# Patient Record
Sex: Male | Born: 1995 | Race: Black or African American | Hispanic: No | Marital: Single | Smoking: Never smoker
Health system: Southern US, Community
[De-identification: ages and names within clinical notes are randomized; demographics above are authoritative.]

---
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
## 2019-07-12 ENCOUNTER — Other Ambulatory Visit: Payer: Self-pay

## 2019-07-12 ENCOUNTER — Emergency Department (HOSPITAL_COMMUNITY)
Admission: EM | Admit: 2019-07-12 | Discharge: 2019-07-12 | Disposition: A | Discharge status: Home / Self Care | Payer: Self-pay | Attending: Emergency Medicine | Admitting: Emergency Medicine

## 2019-07-12 DIAGNOSIS — R0789 Other chest pain: Secondary | ICD-10-CM | POA: Insufficient documentation

## 2019-07-12 DIAGNOSIS — Z5321 Procedure and treatment not carried out due to patient leaving prior to being seen by health care provider: Secondary | ICD-10-CM | POA: Insufficient documentation

## 2019-07-12 NOTE — ED Notes (Signed)
Pt called 3x for triage. No answer. Pt not seen in waiting room, waiting room bathroom, or outside lobby doors.

## 2019-07-21 ENCOUNTER — Ambulatory Visit (INDEPENDENT_AMBULATORY_CARE_PROVIDER_SITE_OTHER): Payer: Self-pay

## 2019-07-21 ENCOUNTER — Ambulatory Visit (HOSPITAL_COMMUNITY)
Admission: EM | Admit: 2019-07-21 | Discharge: 2019-07-21 | Disposition: A | Discharge status: Home / Self Care | Payer: Self-pay | Attending: Emergency Medicine | Admitting: Emergency Medicine

## 2019-07-21 ENCOUNTER — Encounter (HOSPITAL_COMMUNITY): Payer: Self-pay

## 2019-07-21 ENCOUNTER — Other Ambulatory Visit: Payer: Self-pay

## 2019-07-21 DIAGNOSIS — S93402A Sprain of unspecified ligament of left ankle, initial encounter: Secondary | ICD-10-CM

## 2019-07-21 DIAGNOSIS — M25572 Pain in left ankle and joints of left foot: Secondary | ICD-10-CM

## 2019-07-21 MED ORDER — IBUPROFEN 800 MG PO TABS: 800.0000 mg | ORAL_TABLET | Freq: Three times a day (TID) | ORAL | 0 refills | Status: AC

## 2019-07-21 NOTE — ED Triage Notes (Signed)
Pt is here with left ankle pain after spraining left ankle on Monday after playing basketball, pt has taken Tylenol to relieve discomfort.

## 2019-07-21 NOTE — Discharge Instructions (Signed)
No fracture on xray  Use anti-inflammatories for pain/swelling. You may take up to 800 mg Ibuprofen every 8 hours with food. You may supplement Ibuprofen with Tylenol [phone] mg every 8 hours.  Ice and elevate Boot for comfort Follow up if not improving over the next 1-2 weeks

## 2019-07-21 NOTE — ED Provider Notes (Signed)
[facility]    CSN: [id number] Arrival date & time: 07/21/19  1144      History   Chief Complaint Chief Complaint  Patient presents with  . Ankle Injury    HPI Rodney Frank is a 24 y.o. male presenting today for evaluation of left ankle injury.  Patient reports approximately 1 week ago he was playing basketball and rolled his left ankle and has since had pain and swelling.  He has been using the boot from prior ankle sprains.  Does report prior fracture in this ankle.  Has used ibuprofen 400 mg occasionally.  HPI  History reviewed. No pertinent past medical history.  There are no problems to display for this patient.   History reviewed. No pertinent surgical history.     Home Medications    Prior to Admission medications   Medication Sig Start Date End Date Taking? Authorizing Provider  ibuprofen (ADVIL) 800 MG tablet Take 1 tablet (800 mg total) by mouth 3 (three) times daily. 07/21/19   Decari Duggar, Junius Creamer, PA-C    Family History Family History  Problem Relation Age of Onset  . Healthy Mother   . Healthy Father     Social History Social History   Tobacco Use  . Smoking status: Never Smoker  . Smokeless tobacco: Never Used  Substance Use Topics  . Alcohol use: No  . Drug use: Yes    Types: Marijuana     Allergies   Patient has no known allergies.   Review of Systems Review of Systems  Constitutional: Negative for fatigue and fever.  Eyes: Negative for redness, itching and visual disturbance.  Respiratory: Negative for shortness of breath.   Cardiovascular: Negative for chest pain and leg swelling.  Gastrointestinal: Negative for nausea and vomiting.  Musculoskeletal: Positive for arthralgias, gait problem and joint swelling. Negative for myalgias.  Skin: Negative for color change, rash and wound.  Neurological: Negative for dizziness, syncope, weakness, light-headedness and headaches.     Physical Exam Triage Vital Signs ED  Triage Vitals  Enc Vitals Group     BP 07/21/19 1206 116/71     Pulse Rate 07/21/19 1206 (!) 58     Resp 07/21/19 1206 18     Temp 07/21/19 1206 98.3 F (36.8 C)     Temp Source 07/21/19 1206 Oral     SpO2 07/21/19 1206 98 %     Weight --      Height --      Head Circumference --      Peak Flow --      Pain Score 07/21/19 1203 6     Pain Loc --      Pain Edu? --      Excl. in GC? --    No data found.  Updated Vital Signs BP 116/71 (BP Location: Right Arm)   Pulse (!) 58   Temp 98.3 F (36.8 C) (Oral)   Resp 18   SpO2 98%   Visual Acuity Right Eye Distance:   Left Eye Distance:   Bilateral Distance:    Right Eye Near:   Left Eye Near:    Bilateral Near:     Physical Exam Vitals and nursing note reviewed.  Constitutional:      Appearance: He is well-developed.     Comments: No acute distress  HENT:     Head: Normocephalic and atraumatic.     Nose: Nose normal.  Eyes:     Conjunctiva/sclera: Conjunctivae normal.  Cardiovascular:  Rate and Rhythm: Normal rate.  Pulmonary:     Effort: Pulmonary effort is normal. No respiratory distress.  Abdominal:     General: There is no distension.  Musculoskeletal:        General: Normal range of motion.     Cervical back: Neck supple.     Comments: Left ankle: No discoloration or erythema, moderate swelling noted about lateral malleolus, tender to palpation over this area, extending into proximal dorsum of foot, extends slightly into distal fibula/lower leg, nontender at fibular insertion at knee, nontender at medial malleolus Dorsalis pedis 2+, sensation intact distally  Skin:    General: Skin is warm and dry.  Neurological:     Mental Status: He is alert and oriented to person, place, and time.      UC Treatments / Results  Labs (all labs ordered are listed, but only abnormal results are displayed) Labs Reviewed - No data to display  EKG   Radiology DG Ankle Complete Left  Result Date:  07/21/2019 CLINICAL DATA:  LEFT ankle pain for 1 week after playing basketball, pain with weight-bearing especially laterally, swelling EXAM: LEFT ANKLE COMPLETE - 3+ VIEW COMPARISON:  None FINDINGS: Lateral soft tissue swelling. Osseous mineralization normal. Joint spaces preserved. Tiny old corticated ossicle inferior to lateral malleolus. No acute fracture, dislocation, or bone destruction. Suspect ankle joint effusion. IMPRESSION: Lateral soft tissue swelling and probable ankle joint effusion. No acute osseous abnormalities. Electronically Signed   By: Ulyses Southward M.D.   On: 07/21/2019 13:37    Procedures Procedures (including critical care time)  Medications Ordered in UC Medications - No data to display  Initial Impression / Assessment and Plan / UC Course  I have reviewed the triage vital signs and the nursing notes.  Pertinent labs & imaging results that were available during my care of the patient were reviewed by me and considered in my medical decision making (see chart for details).     X-ray negative for acute abnormality.  Does have joint effusion.  Suspect likely sprain.  May continue to use boot for comfort, slowly transition back to normal shoes as pain improving.  Anti-inflammatories ice and elevation.  Discussed strict return precautions. Patient verbalized understanding and is agreeable with plan.  Final Clinical Impressions(s) / UC Diagnoses   Final diagnoses:  Sprain of left ankle, unspecified ligament, initial encounter     Discharge Instructions     No fracture on xray  Use anti-inflammatories for pain/swelling. You may take up to 800 mg Ibuprofen every 8 hours with food. You may supplement Ibuprofen with Tylenol [phone] mg every 8 hours.  Ice and elevate Boot for comfort Follow up if not improving over the next 1-2 weeks   ED Prescriptions    Medication Sig Dispense Auth. Provider   ibuprofen (ADVIL) 800 MG tablet Take 1 tablet (800 mg total) by mouth 3  (three) times daily. 21 tablet Sameeha Rockefeller, [place] C, PA-C     PDMP not reviewed this encounter.   Lew Dawes, [place] 07/21/19 1354

## 2019-10-21 ENCOUNTER — Ambulatory Visit (HOSPITAL_COMMUNITY): Payer: No Payment, Other | Admitting: Behavioral Health

## 2020-01-07 ENCOUNTER — Other Ambulatory Visit: Payer: Self-pay

## 2020-01-07 DIAGNOSIS — Z20822 Contact with and (suspected) exposure to covid-19: Secondary | ICD-10-CM

## 2020-01-10 LAB — NOVEL CORONAVIRUS, NAA: SARS-CoV-2, NAA: DETECTED — AB

## 2020-03-23 ENCOUNTER — Encounter (HOSPITAL_COMMUNITY): Payer: Self-pay

## 2020-03-23 ENCOUNTER — Ambulatory Visit (HOSPITAL_COMMUNITY)
Admission: EM | Admit: 2020-03-23 | Discharge: 2020-03-23 | Disposition: A | Discharge status: Home / Self Care | Payer: Self-pay

## 2020-03-23 ENCOUNTER — Other Ambulatory Visit: Payer: Self-pay

## 2020-03-23 DIAGNOSIS — Z7689 Persons encountering health services in other specified circumstances: Secondary | ICD-10-CM

## 2020-03-23 DIAGNOSIS — Z87828 Personal history of other (healed) physical injury and trauma: Secondary | ICD-10-CM

## 2020-03-23 NOTE — ED Provider Notes (Signed)
[facility]    CSN: [id number] Arrival date & time: 03/23/20  1832      History   Chief Complaint Chief Complaint  Patient presents with  . Work Note     HPI Rodney Frank is a 25 y.o. male presenting for work note following twisting ankle 1.5 weeks ago. Requires note to return to work.  States that he was playing basketball and stepped on somebody's foot with his left foot.  This caused some pain and swelling initially, but this is completely resolved.  Denies absolutely any pain in the ankle or anywhere else today.  Denies sensation changes.  HPI  History reviewed. No pertinent past medical history.  There are no problems to display for this patient.   History reviewed. No pertinent surgical history.     Home Medications    Prior to Admission medications   Medication Sig Start Date End Date Taking? Authorizing Provider  ibuprofen (ADVIL) 800 MG tablet Take 1 tablet (800 mg total) by mouth 3 (three) times daily. 07/21/19   Wieters, Junius Creamer, PA-C    Family History Family History  Problem Relation Age of Onset  . Healthy Mother   . Healthy Father     Social History Social History   Tobacco Use  . Smoking status: Never Smoker  . Smokeless tobacco: Never Used  Substance Use Topics  . Alcohol use: No  . Drug use: Yes    Types: Marijuana     Allergies   Patient has no known allergies.   Review of Systems Review of Systems  All other systems reviewed and are negative.    Physical Exam Triage Vital Signs ED Triage Vitals  Enc Vitals Group     BP 03/23/20 1919 126/75     Pulse Rate 03/23/20 1919 61     Resp 03/23/20 1919 18     Temp 03/23/20 1919 97.9 F (36.6 C)     Temp Source 03/23/20 1919 Oral     SpO2 03/23/20 1919 100 %     Weight --      Height --      Head Circumference --      Peak Flow --      Pain Score 03/23/20 1920 0     Pain Loc --      Pain Edu? --      Excl. in GC? --    No data found.  Updated Vital  Signs BP 126/75 (BP Location: Right Arm)   Pulse 61   Temp 97.9 F (36.6 C) (Oral)   Resp 18   SpO2 100%   Visual Acuity Right Eye Distance:   Left Eye Distance:   Bilateral Distance:    Right Eye Near:   Left Eye Near:    Bilateral Near:     Physical Exam Vitals reviewed.  Constitutional:      Appearance: Normal appearance.  HENT:     Head: Normocephalic and atraumatic.  Cardiovascular:     Rate and Rhythm: Normal rate.  Pulmonary:     Effort: Pulmonary effort is normal.  Musculoskeletal:     Comments: L ankle without pain, swelling. No pain over lateral or medial malleolus. No midfoot pain. DP 2+, cap refill < 2 seconds. Absolutely no pain, injury, swelling, deformity, ecchymosis elsewhere.  Skin:    Capillary Refill: Capillary refill takes less than 2 seconds.  Neurological:     General: No focal deficit present.     Mental Status: He is  alert and oriented to person, place, and time.  Psychiatric:        Mood and Affect: Mood normal.        Behavior: Behavior normal.        Thought Content: Thought content normal.        Judgment: Judgment normal.      UC Treatments / Results  Labs (all labs ordered are listed, but only abnormal results are displayed) Labs Reviewed - No data to display  EKG   Radiology No results found.  Procedures Procedures (including critical care time)  Medications Ordered in UC Medications - No data to display  Initial Impression / Assessment and Plan / UC Course  I have reviewed the triage vital signs and the nursing notes.  Pertinent labs & imaging results that were available during my care of the patient were reviewed by me and considered in my medical decision making (see chart for details).     This patient is a 25 year old male presenting for clearance to return to work. This was provided today.   This chart was dictated using voice recognition software, Dragon. Despite the best efforts of this provider to proofread  and correct errors, errors may still occur which can change documentation meaning.  Final Clinical Impressions(s) / UC Diagnoses   Final diagnoses:  History of ankle sprain  Return to work evaluation     Discharge Instructions     -you're fully cleared to go back to work. If you find that standing or walking for long periods of time hurts the ankle- then stop and rest, or stop activity.    ED Prescriptions    None     PDMP not reviewed this encounter.   Rhys Martini, PA-C 03/23/20 2010

## 2020-03-23 NOTE — ED Triage Notes (Signed)
Pt presents for work note after missing work because he twisted his ankle.

## 2020-03-23 NOTE — Discharge Instructions (Addendum)
-  you're fully cleared to go back to work. If you find that standing or walking for long periods of time hurts the ankle- then stop and rest, or stop activity.

## 2021-12-29 ENCOUNTER — Telehealth: Payer: Self-pay

## 2021-12-29 NOTE — Telephone Encounter (Signed)
LVM. Sending Mychart msg. AS, CMA 

## 2022-06-08 ENCOUNTER — Emergency Department (HOSPITAL_COMMUNITY)
Admission: EM | Admit: 2022-06-08 | Discharge: 2022-06-08 | Discharge status: Against Medical Advice | Payer: Medicaid Other | Attending: Emergency Medicine | Admitting: Emergency Medicine

## 2022-06-08 ENCOUNTER — Encounter (HOSPITAL_COMMUNITY): Payer: Self-pay

## 2022-06-08 ENCOUNTER — Other Ambulatory Visit: Payer: Self-pay

## 2022-06-08 DIAGNOSIS — R1110 Vomiting, unspecified: Secondary | ICD-10-CM | POA: Insufficient documentation

## 2022-06-08 DIAGNOSIS — Z5321 Procedure and treatment not carried out due to patient leaving prior to being seen by health care provider: Secondary | ICD-10-CM | POA: Insufficient documentation

## 2022-06-08 DIAGNOSIS — R109 Unspecified abdominal pain: Secondary | ICD-10-CM | POA: Diagnosis not present

## 2022-06-08 LAB — URINALYSIS, ROUTINE W REFLEX MICROSCOPIC
Bilirubin Urine: NEGATIVE
Glucose, UA: NEGATIVE mg/dL
Hgb urine dipstick: NEGATIVE
Ketones, ur: NEGATIVE mg/dL
Leukocytes,Ua: NEGATIVE
Nitrite: NEGATIVE
Protein, ur: NEGATIVE mg/dL
Specific Gravity, Urine: 1.019 (ref 1.005–1.030)
pH: 6 (ref 5.0–8.0)

## 2022-06-08 LAB — COMPREHENSIVE METABOLIC PANEL
ALT: 26 U/L (ref 0–44)
AST: 51 U/L — ABNORMAL HIGH (ref 15–41)
Albumin: 4.4 g/dL (ref 3.5–5.0)
Alkaline Phosphatase: 59 U/L (ref 38–126)
Anion gap: 10 (ref 5–15)
BUN: 8 mg/dL (ref 6–20)
CO2: 20 mmol/L — ABNORMAL LOW (ref 22–32)
Calcium: 9.3 mg/dL (ref 8.9–10.3)
Chloride: 107 mmol/L (ref 98–111)
Creatinine, Ser: 1.27 mg/dL — ABNORMAL HIGH (ref 0.61–1.24)
GFR, Estimated: >60 mL/min (ref >60)
Glucose, Bld: 90 mg/dL (ref 70–99)
Potassium: 3.7 mmol/L (ref 3.5–5.1)
Sodium: 137 mmol/L (ref 135–145)
Total Bilirubin: 0.8 mg/dL (ref 0.3–1.2)
Total Protein: 7.5 g/dL (ref 6.5–8.1)

## 2022-06-08 LAB — CBC
HCT: 45 % (ref 39.0–52.0)
Hemoglobin: 15.2 g/dL (ref 13.0–17.0)
MCH: 26.5 pg (ref 26.0–34.0)
MCHC: 33.8 g/dL (ref 30.0–36.0)
MCV: 78.4 fL — ABNORMAL LOW (ref 80.0–100.0)
Platelets: 164 10*3/uL (ref 150–400)
RBC: 5.74 MIL/uL (ref 4.22–5.81)
RDW: 14.5 % (ref 11.5–15.5)
WBC: 9.6 10*3/uL (ref 4.0–10.5)
nRBC: 0 % (ref 0.0–0.2)

## 2022-06-08 LAB — LIPASE, BLOOD: Lipase: 22 U/L (ref 11–51)

## 2022-06-08 NOTE — ED Triage Notes (Addendum)
Pt came in via POV d/t vomiting since last night & 6/10 abd pain. Denies fevers, endorses not getting good sleep, diarrhea & feeling dehydrated as well. Reports he was around someone with similar s/s recently, A/Ox4.

## 2022-06-08 NOTE — ED Notes (Signed)
Pt not answering x3, dragging OTF.

## 2022-11-10 DIAGNOSIS — Z113 Encounter for screening for infections with a predominantly sexual mode of transmission: Secondary | ICD-10-CM | POA: Diagnosis not present
